# Patient Record
Sex: Female | Born: 1960 | Race: White | Hispanic: No | Marital: Married | Smoking: Never smoker
Health system: Southern US, Community
[De-identification: ages and names within clinical notes are randomized; demographics above are authoritative.]

## PROBLEM LIST (undated history)

## (undated) DIAGNOSIS — T7840XA Allergy, unspecified, initial encounter: Secondary | ICD-10-CM

## (undated) DIAGNOSIS — R011 Cardiac murmur, unspecified: Secondary | ICD-10-CM

## (undated) HISTORY — PX: TONSILLECTOMY: SUR1361

## (undated) HISTORY — DX: Cardiac murmur, unspecified: R01.1

## (undated) HISTORY — DX: Allergy, unspecified, initial encounter: T78.40XA

## (undated) HISTORY — PX: OTHER SURGICAL HISTORY: SHX169

---

## 1999-08-19 ENCOUNTER — Other Ambulatory Visit: Admission: RE | Admit: 1999-08-19 | Discharge: 1999-08-19 | Payer: Self-pay | Admitting: Obstetrics and Gynecology

## 2000-09-13 ENCOUNTER — Other Ambulatory Visit: Admission: RE | Admit: 2000-09-13 | Discharge: 2000-09-13 | Payer: Self-pay | Admitting: Obstetrics and Gynecology

## 2001-09-19 ENCOUNTER — Other Ambulatory Visit: Admission: RE | Admit: 2001-09-19 | Discharge: 2001-09-19 | Payer: Self-pay | Admitting: Obstetrics and Gynecology

## 2002-11-01 ENCOUNTER — Other Ambulatory Visit: Admission: RE | Admit: 2002-11-01 | Discharge: 2002-11-01 | Payer: Self-pay | Admitting: Obstetrics and Gynecology

## 2003-11-04 ENCOUNTER — Other Ambulatory Visit: Admission: RE | Admit: 2003-11-04 | Discharge: 2003-11-04 | Payer: Self-pay | Admitting: Obstetrics and Gynecology

## 2003-12-09 ENCOUNTER — Encounter (INDEPENDENT_AMBULATORY_CARE_PROVIDER_SITE_OTHER): Payer: Self-pay | Admitting: Specialist

## 2003-12-09 ENCOUNTER — Ambulatory Visit (HOSPITAL_COMMUNITY): Admission: RE | Admit: 2003-12-09 | Discharge: 2003-12-09 | Payer: Self-pay | Admitting: Obstetrics and Gynecology

## 2004-11-17 ENCOUNTER — Other Ambulatory Visit: Admission: RE | Admit: 2004-11-17 | Discharge: 2004-11-17 | Payer: Self-pay | Admitting: Obstetrics and Gynecology

## 2006-02-15 ENCOUNTER — Encounter: Admission: RE | Admit: 2006-02-15 | Discharge: 2006-02-15 | Payer: Self-pay | Admitting: Obstetrics and Gynecology

## 2006-08-08 ENCOUNTER — Encounter: Admission: RE | Admit: 2006-08-08 | Discharge: 2006-08-08 | Payer: Self-pay | Admitting: Obstetrics and Gynecology

## 2007-02-21 ENCOUNTER — Encounter: Admission: RE | Admit: 2007-02-21 | Discharge: 2007-02-21 | Payer: Self-pay | Admitting: Obstetrics and Gynecology

## 2008-04-10 ENCOUNTER — Ambulatory Visit (HOSPITAL_COMMUNITY): Admission: RE | Admit: 2008-04-10 | Discharge: 2008-04-10 | Payer: Self-pay | Admitting: Obstetrics and Gynecology

## 2008-04-17 ENCOUNTER — Encounter: Admission: RE | Admit: 2008-04-17 | Discharge: 2008-04-17 | Payer: Self-pay | Admitting: Obstetrics and Gynecology

## 2008-12-04 ENCOUNTER — Encounter: Admission: RE | Admit: 2008-12-04 | Discharge: 2008-12-04 | Payer: Self-pay | Admitting: Family Medicine

## 2009-04-21 ENCOUNTER — Encounter: Admission: RE | Admit: 2009-04-21 | Discharge: 2009-04-21 | Payer: Self-pay | Admitting: Obstetrics and Gynecology

## 2010-03-07 IMAGING — MG MM SCREEN MAMMOGRAM BILATERAL
4 series · 4 of 4 positions shown · non-contrast
Comparison: none

DG SCREEN MAMMOGRAM BILATERAL
Bilateral CC and MLO view(s) were taken.

DIGITAL SCREENING MAMMOGRAM WITH CAD:
There are scattered fibroglandular densities.  No masses or malignant type calcifications are 
identified.  Compared with prior studies.
Images were processed with CAD.

[R CC]
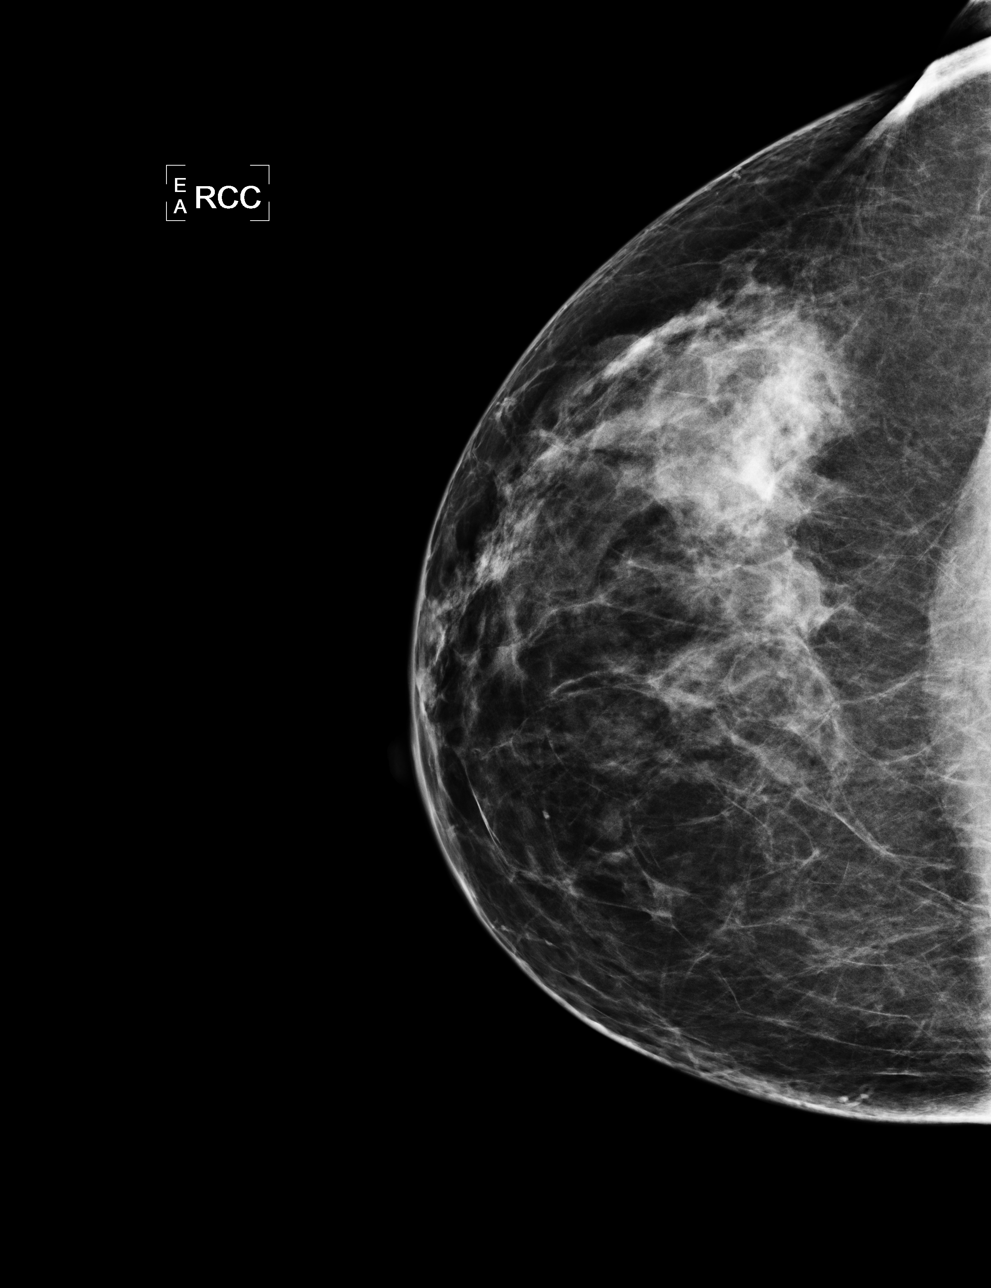

[L CC]
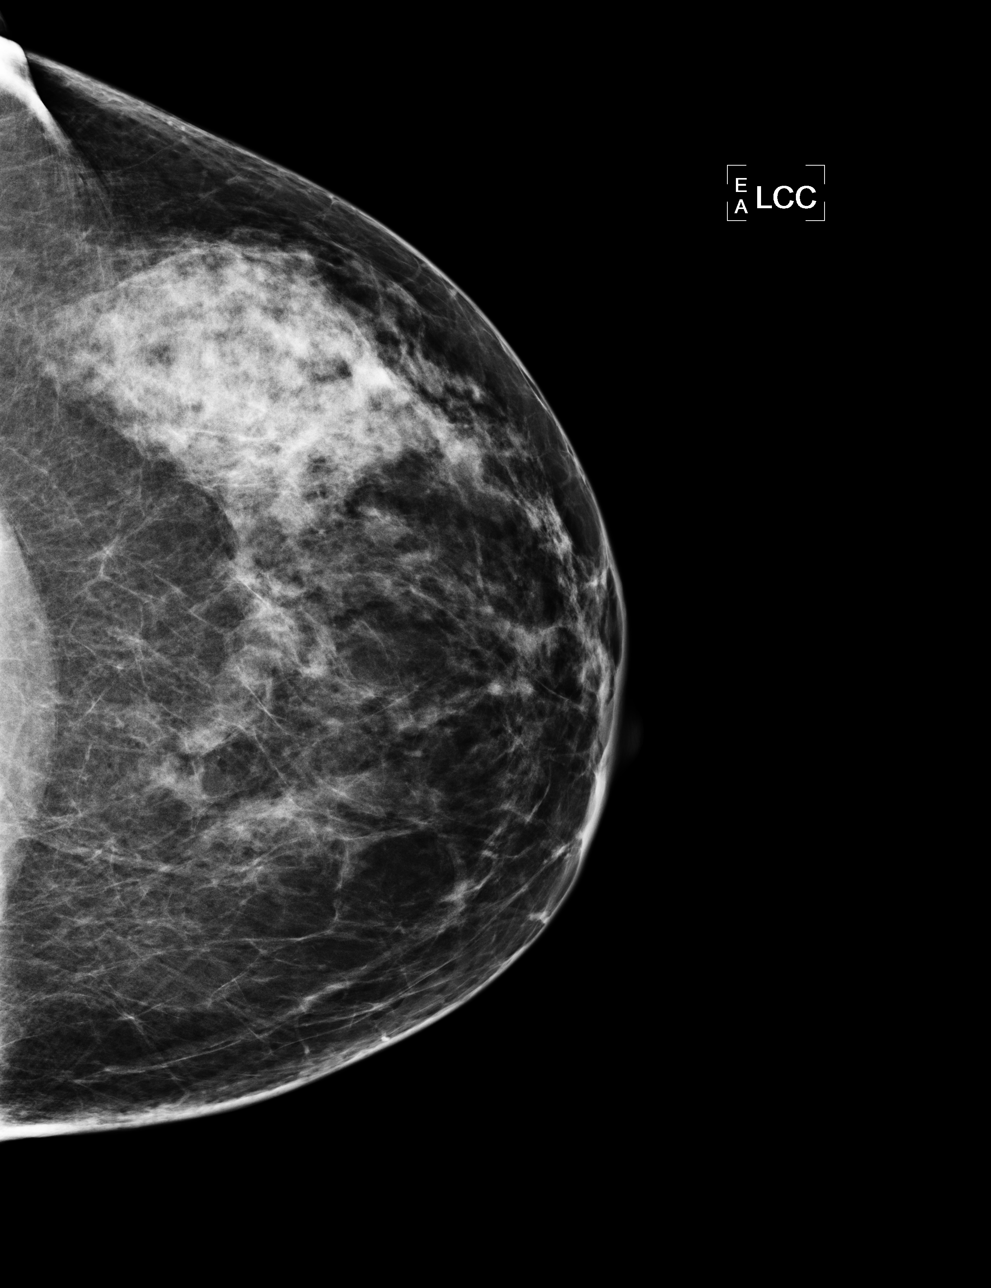

[L MLO]
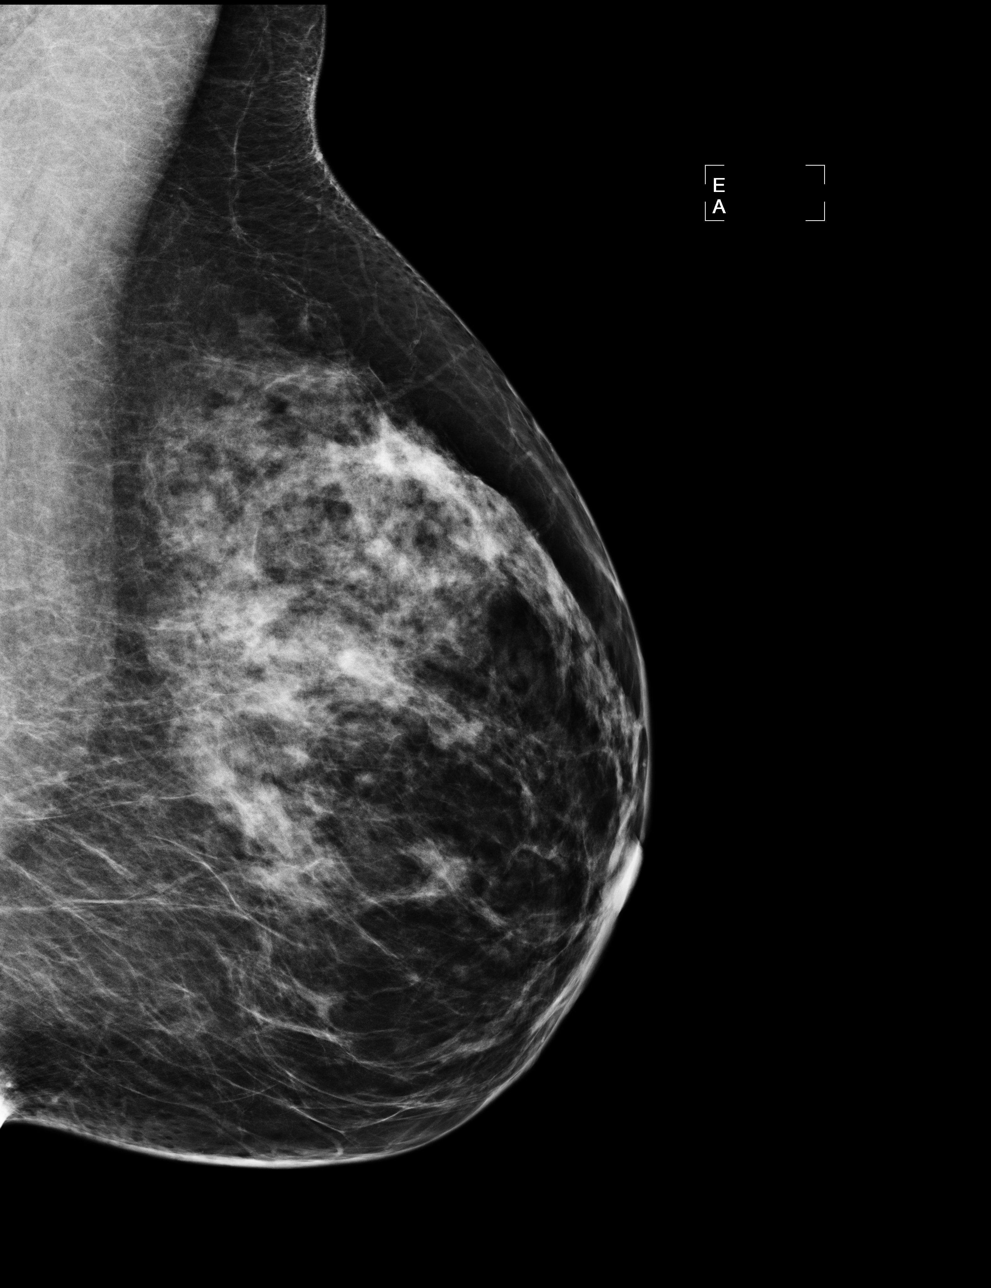

[R MLO]
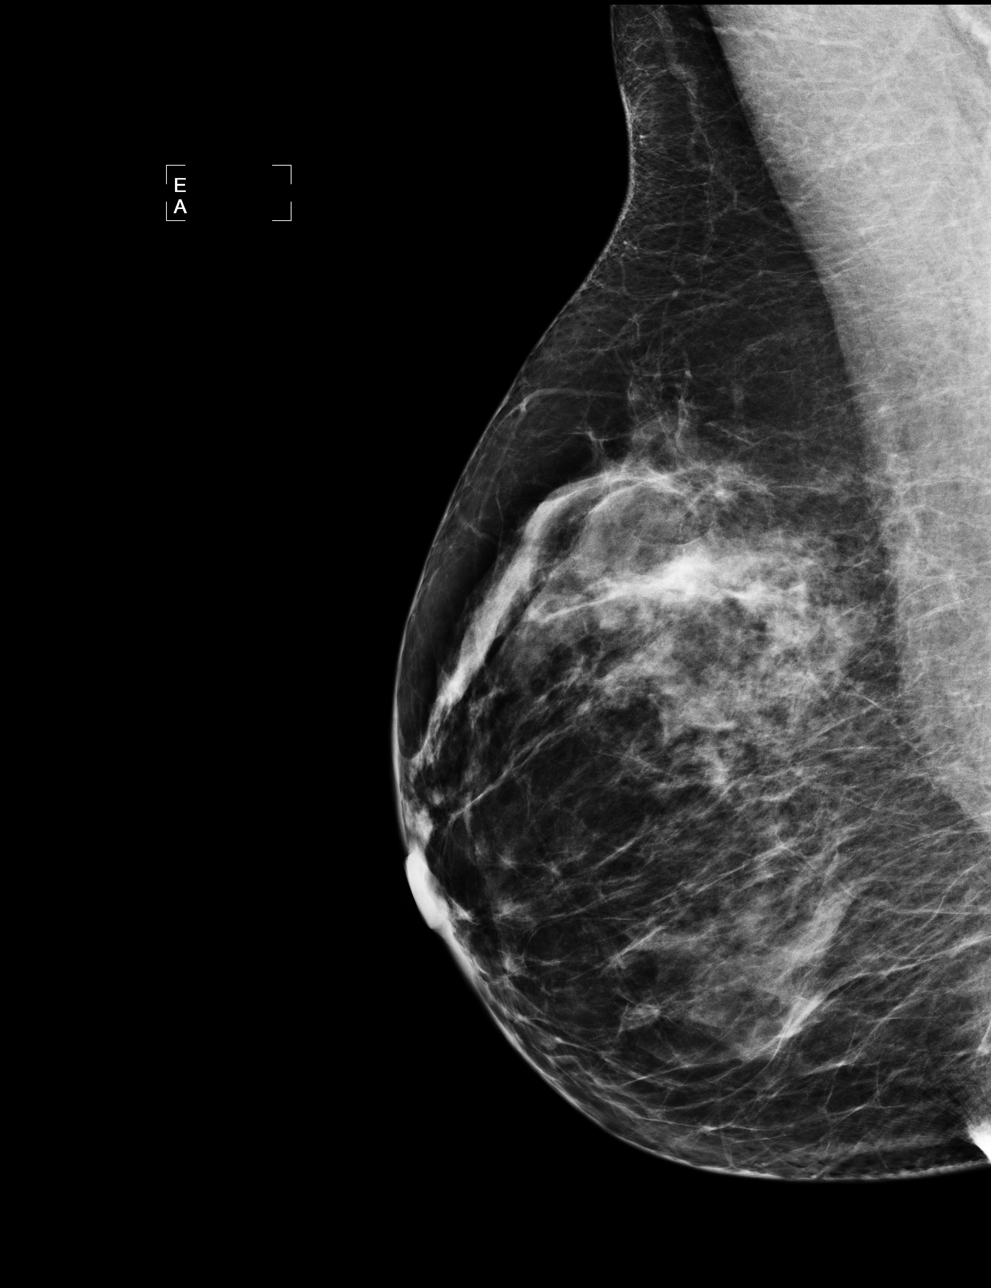

[4 of 4 positions shown; findings below may reference images not displayed]

IMPRESSION: No specific mammographic evidence of malignancy.  Next screening mammogram is recommended in one 
year.

A result letter of this screening mammogram will be mailed directly to the patient.

ASSESSMENT: Negative - BI-RADS 1

Screening mammogram in 1 year.
ANALYZED BY COMPUTER AIDED DETECTION. , THIS PROCEDURE WAS A DIGITAL MAMMOGRAM.

## 2010-10-25 ENCOUNTER — Encounter: Payer: Self-pay | Admitting: Obstetrics and Gynecology

## 2010-10-26 ENCOUNTER — Encounter: Payer: Self-pay | Admitting: Obstetrics and Gynecology

## 2011-02-19 NOTE — Op Note (Signed)
NAME:  Jodi Strickland, LEDER                         ACCOUNT NO.:  [id number]   MEDICAL RECORD NO.:  [id number]                   PATIENT TYPE:  AMB   LOCATION:  SDC                                  FACILITY:  [facility]   PHYSICIAN:  Dineen Kid. Rana Snare, M.D.                 DATE OF BIRTH:  06-26-1961   DATE OF PROCEDURE:  12/09/2003  DATE OF DISCHARGE:                                 OPERATIVE REPORT   ADDENDUM:   ADDITIONAL PREOPERATIVE DIAGNOSIS:  Vulvar skin tag.   ADDITIONAL POSTOPERATIVE DIAGNOSIS:  Vulvar skin tag.   ADDITIONAL PROCEDURE:  Removal of skin tag.   DESCRIPTION OF PROCEDURE:  Sharply excised with scissors using pickups and  scissors.  The base of the skin tag was made hemostatic and approximated  with single horizontal mattress suture of 4-0 Vicryl Rapide with good  approximation and good hemostasis.   ESTIMATED BLOOD LOSS:  Less than 5 mL.                                               Dineen Kid Rana Snare, M.D.    DCL/MEDQ  D:  12/09/2003  T:  12/10/2003  Job:  [id number]

## 2011-02-19 NOTE — Op Note (Signed)
NAME:  Jodi Strickland, Jodi Strickland                         ACCOUNT NO.:  [id number]   MEDICAL RECORD NO.:  [id number]                   PATIENT TYPE:  AMB   LOCATION:  SDC                                  FACILITY:  [facility]   PHYSICIAN:  Dineen Kid. Rana Snare, M.D.                 DATE OF BIRTH:  07/27/1961   DATE OF PROCEDURE:  12/09/2003  DATE OF DISCHARGE:                                 OPERATIVE REPORT   PREOPERATIVE DIAGNOSES:  1. Abnormal uterine bleeding.  2. Endometrial mass on sonohistogram.   POSTOPERATIVE DIAGNOSES:  1. Abnormal uterine bleeding.  2. Endometrial mass on sonohistogram.  3. Uterine septum.   PROCEDURE:  Hysteroscopy D&C, with removal of endometrial polyps and  resection of septum.   SURGEON:  Dineen Kid. Rana Snare, M.D.   ANESTHESIA:  General by mask.   INDICATIONS:  Ms. Jodi Strickland is a 50 year old G2, P2 who has been having problems  with abnormal uterine bleeding.  She has known fibroids.  She underwent a  sonohistogram which shows endometrial masses consistent with polyps.  She  presents for definitive surgical evaluation and treatment of this.  The  risks and benefits were discussed, which include but not limited to:  risks  of infection, bleeding, and damage to uterus, tubes and ovaries, bowel or  bladder; possibly recurrence of the bleeding or not able to correct the  bleeding.  She does give her informed consent.   FINDINGS AT TIME OF SURGERY:  A large polyp coming from the right lower  uterine segment. She does have a septum in the mid portion, approximately 2-  3 cm in length, but 1-2 mm in width.  The ostia were within normal limits.  There was a large polyp coming from the left side of the septum, going into  the left cornua.  The cervix was within normal limits.   DESCRIPTION OF PROCEDURE:  After adequate analgesia, the patient was placed  in the dorsal lithotomy position.  She was sterilely prepped and draped and  the bladder was sterilely drained.  A Graves speculum  was placed.  Tenaculum  placed on the lip of the cervix.  Uterus was sounded to  9 cm and easily dilated to a #33 Pratt dilator.   The hysteroscope was inserted; the above findings were noted.  The  endometrial polyp was grasped along the right lateral wall, and easily  removed.  Curettage was then performed.  The endometrial polyp from the left  portion of the septum was grasped and partially removed.  Upon further  removal with curettage and with endometrial polyps, a portion of the septum  was torn, so resectoscope was inserted and the remaining fragments of the  endometrial septum were resected with a Endo loop, with good resection  noted.  Hemostasis was achieved.   Curettage was performed throughout the endometrial cavity -- revealing a  gritty service throughout the endometrium.  By  palpation no residual  underlying remnants of the septum.  Re-examination did confirm the above,  with a normal-appearing endometrial cavity at this time.   The resectoscope was removed, as was the tenaculum off the cervix.  The  cervix was noted to be hemostatic.  The patient was then transferred to the  recovery room in stable condition.  The patient was given 30 mg of Toradol  after the procedure.   SORBITOL DEFICIT:  100 cc.   ESTIMATED BLOOD LOSS:  Less than 20 cc.   DISPOSITION:  The patient will be discharged home.  Will follow up in the  office in two to three weeks.  She received instruction sheets for D&C.  She  is advised to return for increased pain, fever or bleeding.                                               Dineen Kid Rana Snare, M.D.    DCL/MEDQ  D:  12/09/2003  T:  12/10/2003  Job:  [id number]

## 2011-10-13 ENCOUNTER — Encounter: Payer: Self-pay | Admitting: Gastroenterology

## 2011-10-20 ENCOUNTER — Ambulatory Visit (AMBULATORY_SURGERY_CENTER): Payer: 59 | Admitting: *Deleted

## 2011-10-20 VITALS — Ht 64.0 in | Wt 148.6 lb

## 2011-10-20 DIAGNOSIS — Z1211 Encounter for screening for malignant neoplasm of colon: Secondary | ICD-10-CM

## 2011-10-20 MED ORDER — PEG-KCL-NACL-NASULF-NA ASC-C 100 G PO SOLR: 1.0000 | Freq: Once | ORAL | Status: DC

## 2011-11-01 ENCOUNTER — Encounter: Payer: Self-pay | Admitting: Gastroenterology

## 2011-11-04 ENCOUNTER — Ambulatory Visit (AMBULATORY_SURGERY_CENTER): Payer: 59 | Admitting: Gastroenterology

## 2011-11-04 ENCOUNTER — Encounter: Payer: Self-pay | Admitting: Gastroenterology

## 2011-11-04 DIAGNOSIS — K5520 Angiodysplasia of colon without hemorrhage: Secondary | ICD-10-CM

## 2011-11-04 DIAGNOSIS — Z1211 Encounter for screening for malignant neoplasm of colon: Secondary | ICD-10-CM

## 2011-11-04 MED ORDER — SODIUM CHLORIDE 0.9 % IV SOLN: 500.0000 mL | INTRAVENOUS | Status: DC

## 2011-11-04 NOTE — Op Note (Signed)
[facility] [address] [place], [place]  [address]  COLONOSCOPY PROCEDURE REPORT  PATIENT:  Jodi, Strickland  MR#:  [id number] BIRTHDATE:  [birth year], 50 yrs. old  GENDER:  female ENDOSCOPIST:  Barbette Hair. Arlyce Dice, MD REF. BY:  Herb Grays, M.D. PROCEDURE DATE:  11/04/2011 PROCEDURE:  Diagnostic Colonoscopy ASA CLASS:  Class I INDICATIONS:  Routine Risk Screening MEDICATIONS:   MAC sedation, administered by CRNA propofol 250mg IV  DESCRIPTION OF PROCEDURE:   After the risks benefits and alternatives of the procedure were thoroughly explained, informed consent was obtained.  Digital rectal exam was performed and revealed small external hemorrhoids.   The [id number] endoscope was introduced through the anus and advanced to the cecum, which was identified by both the appendix and ileocecal valve, without limitations.  The quality of the prep was excellent, using MoviPrep.  The instrument was then slowly withdrawn as the colon was fully examined. <<PROCEDUREIMAGES>>  FINDINGS:  Arteriovenous malformations were seen in the (see image3, image4, and image5). 2 2mm AVMs ascending colon, single 3mm AVM descending colon  This was otherwise a normal examination of the colon (see image1, image6, and image7).   Retroflexed views in the rectum revealed no abnormalities.    The time to cecum = 1) 6.0  minutes. The scope was then withdrawn in  1) 10.50  minutes from the cecum and the procedure completed. COMPLICATIONS:  None ENDOSCOPIC IMPRESSION: 1) AVM's 2) Otherwise normal examination RECOMMENDATIONS: 1) Continue current colorectal screening recommendations for "routine risk" patients with a repeat colonoscopy in 10 years. REPEAT EXAM:  In 10 year(s) for Colonoscopy.  ______________________________ Barbette Hair. Arlyce Dice, MD  CC:  n. eSIGNED:   Barbette Hair. Koralyn Prestage at 11/04/2011 09:34 AM  Thana Ates, [phone]

## 2011-11-04 NOTE — Patient Instructions (Signed)
Resume medications. D/C Instructions reviewed with family. 

## 2011-11-04 NOTE — Progress Notes (Signed)
Patient did not experience any of the following events: a burn prior to discharge; a fall within the facility; wrong site/side/patient/procedure/implant event; or a hospital transfer or hospital admission upon discharge from the facility. (G8907) Patient did not have preoperative order for IV antibiotic SSI prophylaxis. (G8918)  

## 2011-11-05 ENCOUNTER — Telehealth: Payer: Self-pay | Admitting: *Deleted

## 2011-11-05 NOTE — Telephone Encounter (Signed)
  Follow up Call-  Call back number 11/04/2011  Post procedure Call Back phone  # [phone]  Permission to leave phone message Yes     Patient questions:  Do you have a fever, pain , or abdominal swelling? no Pain Score  0 *  Have you tolerated food without any problems? yes  Have you been able to return to your normal activities? yes  Do you have any questions about your discharge instructions: Diet   no Medications  no Follow up visit  no  Do you have questions or concerns about your Care? yes  Actions: * If pain score is 4 or above: No action needed, pain <4.

## 2016-05-10 ENCOUNTER — Other Ambulatory Visit: Payer: Self-pay | Admitting: Family Medicine

## 2016-05-10 DIAGNOSIS — Z1231 Encounter for screening mammogram for malignant neoplasm of breast: Secondary | ICD-10-CM

## 2016-05-19 ENCOUNTER — Ambulatory Visit
Admission: RE | Admit: 2016-05-19 | Discharge: 2016-05-19 | Disposition: A | Discharge status: Home / Self Care | Payer: PRIVATE HEALTH INSURANCE | Source: Ambulatory Visit | Attending: Family Medicine | Admitting: Family Medicine

## 2016-05-19 DIAGNOSIS — Z1231 Encounter for screening mammogram for malignant neoplasm of breast: Secondary | ICD-10-CM

## 2018-05-19 ENCOUNTER — Other Ambulatory Visit: Payer: Self-pay | Admitting: Family Medicine

## 2018-05-19 DIAGNOSIS — Z1231 Encounter for screening mammogram for malignant neoplasm of breast: Secondary | ICD-10-CM

## 2018-06-19 ENCOUNTER — Ambulatory Visit: Payer: PRIVATE HEALTH INSURANCE

## 2018-06-19 ENCOUNTER — Ambulatory Visit
Admission: RE | Admit: 2018-06-19 | Discharge: 2018-06-19 | Disposition: A | Discharge status: Home / Self Care | Payer: PRIVATE HEALTH INSURANCE | Source: Ambulatory Visit | Attending: Family Medicine | Admitting: Family Medicine

## 2018-06-19 DIAGNOSIS — Z1231 Encounter for screening mammogram for malignant neoplasm of breast: Secondary | ICD-10-CM

## 2020-08-18 ENCOUNTER — Ambulatory Visit (INDEPENDENT_AMBULATORY_CARE_PROVIDER_SITE_OTHER): Payer: PRIVATE HEALTH INSURANCE | Admitting: Otolaryngology

## 2020-08-18 ENCOUNTER — Encounter (INDEPENDENT_AMBULATORY_CARE_PROVIDER_SITE_OTHER): Payer: Self-pay | Admitting: Otolaryngology

## 2020-08-18 ENCOUNTER — Other Ambulatory Visit: Payer: Self-pay

## 2020-08-18 VITALS — Temp 97.3°F

## 2020-08-18 DIAGNOSIS — H9042 Sensorineural hearing loss, unilateral, left ear, with unrestricted hearing on the contralateral side: Secondary | ICD-10-CM

## 2020-08-18 NOTE — Progress Notes (Signed)
HPI: Jodi Strickland is a 59 y.o. female who presents for evaluation of left ear hearing loss. Patient has noticed gradual hearing loss in the left ear over the past year or 2. She states that she had a hearing test during pregnancy but her hearing returned back to normal. She has not had a hearing test in the last 20 years. She brings with her a recent hearing test that showed a left ear sensorineural hearing loss with essentially normal hearing in the right ear. Right ear pure tones were approximately 10 DB up to 3000 frequency and then declined. Left ear hearing was approximately 30 DB and then declined above 2000 DB.  Past Medical History:  Diagnosis Date  . Allergy   . Heart murmur    Past Surgical History:  Procedure Laterality Date  . c sections     twice  . TONSILLECTOMY     Social History   Socioeconomic History  . Marital status: Married    Spouse name: Not on file  . Number of children: Not on file  . Years of education: Not on file  . Highest education level: Not on file  Occupational History  . Not on file  Tobacco Use  . Smoking status: Never Smoker  . Smokeless tobacco: Never Used  Substance and Sexual Activity  . Alcohol use: No  . Drug use: No  . Sexual activity: Not on file  Other Topics Concern  . Not on file  Social History Narrative  . Not on file   Social Determinants of Health   Financial Resource Strain:   . Difficulty of Paying Living Expenses: Not on file  Food Insecurity:   . Worried About Charity fundraiser in the Last Year: Not on file  . Ran Out of Food in the Last Year: Not on file  Transportation Needs:   . Lack of Transportation (Medical): Not on file  . Lack of Transportation (Non-Medical): Not on file  Physical Activity:   . Days of Exercise per Week: Not on file  . Minutes of Exercise per Session: Not on file  Stress:   . Feeling of Stress : Not on file  Social Connections:   . Frequency of Communication with Friends and  Family: Not on file  . Frequency of Social Gatherings with Friends and Family: Not on file  . Attends Religious Services: Not on file  . Active Member of Clubs or Organizations: Not on file  . Attends Archivist Meetings: Not on file  . Marital Status: Not on file   Family History  Problem Relation Age of Onset  . Colon cancer Neg Hx   . Esophageal cancer Neg Hx   . Rectal cancer Neg Hx   . Stomach cancer Neg Hx   . Breast cancer Neg Hx    Allergies  Allergen Reactions  . Penicillins Rash   Prior to Admission medications   Medication Sig Start Date End Date Taking? Authorizing Provider  CALCIUM PO Take 1,200 mg by mouth daily.   Yes [provider]  Cholecalciferol (VITAMIN D PO) Take 1,000 Units by mouth daily.   Yes [provider]  DOCUSATE CALCIUM PO Take 100 mg by mouth daily.   Yes [provider]  Ibuprofen (ADVIL) 200 MG CAPS Take 200 mg by mouth daily.   Yes [provider]  MAGNESIUM PO Take 250 mg by mouth daily.   Yes [provider]     Positive ROS: Otherwise  negative  All other systems have been reviewed and were otherwise negative with the exception of those mentioned in the HPI and as above.  Physical Exam: Constitutional: Alert, well-appearing, no acute distress Ears: External ears without lesions or tenderness. Ear canals are clear bilaterally with intact, clear TMs bilaterally. TMs have good mobility on pneumatic otoscopy and no middle ear space abnormality noted. Nasal: External nose without lesions.. Clear nasal passages Oral: Lips and gums without lesions. Tongue and palate mucosa without lesions. Posterior oropharynx clear. Neck: No palpable adenopathy or masses Respiratory: Breathing comfortably  Skin: No facial/neck lesions or rash noted.  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  Plan: Reviewed with the patient that this is a sensorineural hearing loss and would recommend obtaining a MRI scan to evaluate for cochlear or retrocochlear pathology. She will call us back following results of the scan. She would be a candidate for hearing aid in the left ear but would wait until after the scan.  Radene Journey, MD

## 2020-08-19 ENCOUNTER — Other Ambulatory Visit (INDEPENDENT_AMBULATORY_CARE_PROVIDER_SITE_OTHER): Payer: Self-pay

## 2020-08-19 DIAGNOSIS — H9042 Sensorineural hearing loss, unilateral, left ear, with unrestricted hearing on the contralateral side: Secondary | ICD-10-CM

## 2020-08-20 ENCOUNTER — Telehealth (INDEPENDENT_AMBULATORY_CARE_PROVIDER_SITE_OTHER): Payer: Self-pay

## 2020-08-21 ENCOUNTER — Encounter (INDEPENDENT_AMBULATORY_CARE_PROVIDER_SITE_OTHER): Payer: Self-pay

## 2020-09-06 ENCOUNTER — Other Ambulatory Visit: Payer: PRIVATE HEALTH INSURANCE

## 2020-09-10 ENCOUNTER — Ambulatory Visit
Admission: RE | Admit: 2020-09-10 | Discharge: 2020-09-10 | Disposition: A | Discharge status: Home / Self Care | Payer: PRIVATE HEALTH INSURANCE | Source: Ambulatory Visit | Attending: Otolaryngology | Admitting: Otolaryngology

## 2020-09-10 ENCOUNTER — Other Ambulatory Visit: Payer: Self-pay

## 2020-09-10 DIAGNOSIS — H9042 Sensorineural hearing loss, unilateral, left ear, with unrestricted hearing on the contralateral side: Secondary | ICD-10-CM

## 2020-09-10 MED ORDER — GADOBENATE DIMEGLUMINE 529 MG/ML IV SOLN
15.0000 mL | Freq: Once | INTRAVENOUS | Status: AC | PRN
  Administered 2020-09-10: 15 mL via INTRAVENOUS

## 2020-09-17 NOTE — Telephone Encounter (Signed)
Patient called today concerning results of her MRI scan.  The MRI scan was unremarkable with no evidence of brainstem or internal auditory canal or cochlear pathology. Discussed with patient concerning further treatment of her hearing loss and would recommend proceeding with obtaining hearing aid for the ear.

## 2021-12-11 ENCOUNTER — Telehealth: Payer: Self-pay

## 2021-12-11 NOTE — Telephone Encounter (Signed)
I have called and left a message for patient to call and make an appointment with previsit for recall colonoscopy.  ?

## 2022-01-14 ENCOUNTER — Encounter: Payer: Self-pay | Admitting: Gastroenterology

## 2022-01-25 ENCOUNTER — Encounter: Payer: Self-pay | Admitting: Gastroenterology

## 2022-01-25 ENCOUNTER — Ambulatory Visit (INDEPENDENT_AMBULATORY_CARE_PROVIDER_SITE_OTHER): Payer: PRIVATE HEALTH INSURANCE | Admitting: Gastroenterology

## 2022-01-25 VITALS — BP 120/84 | HR 78 | Ht 64.0 in | Wt 161.0 lb

## 2022-01-25 DIAGNOSIS — R194 Change in bowel habit: Secondary | ICD-10-CM

## 2022-01-25 DIAGNOSIS — R1310 Dysphagia, unspecified: Secondary | ICD-10-CM | POA: Diagnosis not present

## 2022-01-25 DIAGNOSIS — Z1211 Encounter for screening for malignant neoplasm of colon: Secondary | ICD-10-CM | POA: Diagnosis not present

## 2022-01-25 DIAGNOSIS — K921 Melena: Secondary | ICD-10-CM

## 2022-01-25 MED ORDER — CLENPIQ 10-3.5-12 MG-GM -GM/160ML PO SOLN: 1.0000 | ORAL | 0 refills | Status: DC

## 2022-01-25 NOTE — Patient Instructions (Addendum)
If you are age 61 or younger, your body mass index should be between 19-25. Your Body mass index is 27.64 kg/m?Marland Kitchen If this is out of the aformentioned range listed, please consider follow up with your Primary Care Provider.  ? ?__________________________________________________________ ? ?The [facility] providers would like to encourage you to use [facility] to communicate with providers for non-urgent requests or questions.  Due to long hold times on the telephone, sending your provider a message by [facility] may be a faster and more efficient way to get a response.  Please allow 48 business hours for a response.  Please remember that this is for non-urgent requests.  ? ?Due to recent changes in healthcare laws, you may see the results of your imaging and laboratory studies on MyChart before your provider has had a chance to review them.  We understand that in some cases there may be results that are confusing or concerning to you. Not all laboratory results come back in the same time frame and the provider may be waiting for multiple results in order to interpret others.  Please give [place] 48 hours in order for your provider to thoroughly review all the results before contacting the office for clarification of your results.  ? ?You have been scheduled for a colonoscopy, and endoscopy. Please follow written instructions given to you at your visit today.  ?Please pick up your prep supplies at the pharmacy within the next 1-3 days. ?If you use inhalers (even only as needed), please bring them with you on the day of your procedure.  ? ?We have sent the following medications to your pharmacy for you to pick up at your convenience: Clenpiq ? ? ?Thank you for choosing me and [facility]. ? ?Gerrit Heck, D.O.  ? ? ?

## 2022-01-25 NOTE — Progress Notes (Signed)
? ? ?          ?Chief Complaint: Hematochezia, colon cancer screening ? ?Referring Provider:     Self ? ? ? ?HPI:   ? ? ?Jodi Strickland is a 61 y.o. female referred to the Gastroenterology Clinic for evaluation of hematochezia and to discuss ongoing colon cancer screening. ? ?Sxs started in 2021 as intemrittent BRBPR. Sxs resolved, then recurred over the last few months. No dyschezia, abdominal pain. Started having hard stools and straining to have BM in last year or so. Has been taking Colace and Psyllium husk daily for last 6-12 months with improvement.  ? ?No known family history of CRC, GI malignancy, liver disease, pancreatic disease, or IBD.  ? ?Separately, she has been having intermittent solid food dysphagia.  Tends to occur with tuna salad, bread, rice.  Clears with water, but occasionally has to regurgitate food back out.  No history of food impactions requiring ER evaluation.  No prior EGD.  No recent history of heartburn, regurgitation, GERD symptoms. ?  ? ?Endoscopic History: ?- Colonoscopy (10/2011): Scattered small nonbleeding AVMs in the ascending and descending colon.  Otherwise normal colon.  Repeat in 10 years ? ? ?Past Medical History:  ?Diagnosis Date  ? Allergy   ? Heart murmur   ? ? ? ?Past Surgical History:  ?Procedure Laterality Date  ? c sections    ? twice  ? TONSILLECTOMY    ? TONSILLECTOMY    ? ?Family History  ?Problem Relation Age of Onset  ? Hypertension Mother   ? Stroke Father   ? CVA Father   ? Colon cancer Neg Hx   ? Esophageal cancer Neg Hx   ? Rectal cancer Neg Hx   ? Stomach cancer Neg Hx   ? Breast cancer Neg Hx   ? ?Social History  ? ?Tobacco Use  ? Smoking status: Never  ? Smokeless tobacco: Never  ?Vaping Use  ? Vaping Use: Never used  ?Substance Use Topics  ? Alcohol use: No  ? Drug use: No  ? ?Current Outpatient Medications  ?Medication Sig Dispense Refill  ? CALCIUM PO Take 1,200 mg by mouth daily.    ? Cholecalciferol (VITAMIN D PO) Take 1,000 Units by mouth daily.     ? DOCUSATE CALCIUM PO Take 100 mg by mouth daily.    ? fluticasone (FLONASE) 50 MCG/ACT nasal spray Place into both nostrils daily.    ? loratadine (CLARITIN) 10 MG tablet Take 10 mg by mouth daily.    ? MAGNESIUM PO Take 250 mg by mouth daily.    ? ?No current facility-administered medications for this visit.  ? ?Allergies  ?Allergen Reactions  ? Sulfa Antibiotics   ? Penicillins Rash  ? ? ? ?Review of Systems: ?All systems reviewed and negative except where noted in HPI.  ? ? ? ?Physical Exam:   ? ?Wt Readings from Last 3 Encounters:  ?01/25/22 161 lb (73 kg)  ?11/04/11 148 lb (67.1 kg)  ?10/20/11 148 lb 9.6 oz (67.4 kg)  ? ? ?BP 120/84 (BP Location: Left Arm, Patient Position: Sitting, Cuff Size: Normal)   Pulse 78   Ht '5\' 4"'$  (1.626 m)   Wt 161 lb (73 kg)   LMP 10/03/2011   SpO2 98%   BMI 27.64 kg/m?  ?Constitutional:  Pleasant, in no acute distress. ?Psychiatric: Normal mood and affect. Behavior is normal. ?Neck supple. No cervical LAD. ?Cardiovascular: Normal rate, regular rhythm. No edema ?Pulmonary/chest: Effort normal and breath sounds  normal. No wheezing, rales or rhonchi. ?Abdominal: Soft, nondistended, nontender. Bowel sounds active throughout. There are no masses palpable. No hepatomegaly. ?Neurological: Alert and oriented to person place and time. ?Skin: Skin is warm and dry. No rashes noted. ?Rectal: Exam deferred to time of colonoscopy.  ? ? ? ?ASSESSMENT AND PLAN;  ? ?1) Colon Cancer screening ?- Due for ongoing CRC screening ?- Schedule colonoscopy ? ?2) Hematochezia ?- Discussed broad DDx to include hemorrhoids, but also has history of AVMs on prior colonoscopy ?- Evaluate for etiology at time of colonoscopy as above ?- If clinically significant hemorrhoids, discussed hemorrhoid band ligation ? ?3) Change in bowel habits ?- Continue psyllium fiber and Colace ?- Evaluate for mucosal/luminal pathology at time of colonoscopy as above ?- Continue adequate hydration ? ?4) Dysphagia ?- EGD to  evaluate for ring, stricture, EOE, etc. with dilation and biopsies as appropriate ?- Cut food into small pieces, chew thoroughly, drink plenty of fluids with meals ?- Evaluate for subtle signs of reflux at time of EGD ? ?The indications, risks, and benefits of EGD and colonoscopy were explained to the patient in detail. Risks include but are not limited to bleeding, perforation, adverse reaction to medications, and cardiopulmonary compromise. Sequelae include but are not limited to the possibility of surgery, hospitalization, and mortality. The patient verbalized understanding and wished to proceed. All questions answered, referred to scheduler and bowel prep ordered. Further recommendations pending results of the exam.  ? ? ? ? ?Lavena Bullion, DO, FACG  01/25/2022, 10:34 AM ? ? ?Fanny Bien, MD ? ?

## 2022-03-02 ENCOUNTER — Encounter: Payer: PRIVATE HEALTH INSURANCE | Admitting: Gastroenterology

## 2022-03-16 ENCOUNTER — Ambulatory Visit (AMBULATORY_SURGERY_CENTER): Payer: PRIVATE HEALTH INSURANCE | Admitting: Gastroenterology

## 2022-03-16 ENCOUNTER — Encounter: Payer: Self-pay | Admitting: Gastroenterology

## 2022-03-16 VITALS — BP 120/74 | HR 73 | Temp 97.1°F | Resp 14 | Ht 64.0 in | Wt 161.0 lb

## 2022-03-16 DIAGNOSIS — D125 Benign neoplasm of sigmoid colon: Secondary | ICD-10-CM

## 2022-03-16 DIAGNOSIS — K2970 Gastritis, unspecified, without bleeding: Secondary | ICD-10-CM

## 2022-03-16 DIAGNOSIS — D123 Benign neoplasm of transverse colon: Secondary | ICD-10-CM | POA: Diagnosis not present

## 2022-03-16 DIAGNOSIS — D120 Benign neoplasm of cecum: Secondary | ICD-10-CM | POA: Diagnosis not present

## 2022-03-16 DIAGNOSIS — Z1211 Encounter for screening for malignant neoplasm of colon: Secondary | ICD-10-CM | POA: Diagnosis present

## 2022-03-16 DIAGNOSIS — K641 Second degree hemorrhoids: Secondary | ICD-10-CM

## 2022-03-16 DIAGNOSIS — D124 Benign neoplasm of descending colon: Secondary | ICD-10-CM | POA: Diagnosis not present

## 2022-03-16 DIAGNOSIS — K200 Eosinophilic esophagitis: Secondary | ICD-10-CM | POA: Diagnosis not present

## 2022-03-16 DIAGNOSIS — R1310 Dysphagia, unspecified: Secondary | ICD-10-CM

## 2022-03-16 DIAGNOSIS — K2990 Gastroduodenitis, unspecified, without bleeding: Secondary | ICD-10-CM | POA: Diagnosis not present

## 2022-03-16 DIAGNOSIS — K319 Disease of stomach and duodenum, unspecified: Secondary | ICD-10-CM

## 2022-03-16 MED ORDER — SODIUM CHLORIDE 0.9 % IV SOLN: 500.0000 mL | Freq: Once | INTRAVENOUS | Status: DC

## 2022-03-16 NOTE — Op Note (Signed)
[place] Patient Name: Lanaya Bennis Procedure Date: 03/16/2022 10:12 AM MRN: [id number] Endoscopist: Gerrit Heck , MD Age: 61 Referring MD:  Date of Birth: 09/08/1961 Gender: Female Account #: [id number] Procedure:                Colonoscopy Indications:              Screening for colorectal malignant neoplasm (last                            colonoscopy was 10 years ago) Medicines:                Monitored Anesthesia Care Procedure:                Pre-Anesthesia Assessment:                           - Prior to the procedure, a History and Physical                            was performed, and patient medications and                            allergies were reviewed. The patient's tolerance of                            previous anesthesia was also reviewed. The risks                            and benefits of the procedure and the sedation                            options and risks were discussed with the patient.                            All questions were answered, and informed consent                            was obtained. Prior Anticoagulants: The patient has                            taken no previous anticoagulant or antiplatelet                            agents. ASA Grade Assessment: II - A patient with                            mild systemic disease. After reviewing the risks                            and benefits, the patient was deemed in                            satisfactory condition to undergo the procedure.  After obtaining informed consent, the colonoscope                            was passed under direct vision. Throughout the                            procedure, the patient's blood pressure, pulse, and                            oxygen saturations were monitored continuously. The                            CF [id number] #[id number] was introduced through the anus                            and advanced to the the  terminal ileum. The                            colonoscopy was performed without difficulty. The                            patient tolerated the procedure well. The quality                            of the bowel preparation was good. The terminal                            ileum, ileocecal valve, appendiceal orifice, and                            rectum were photographed. Scope In: 10:46:35 AM Scope Out: 11:09:56 AM Scope Withdrawal Time: 0 hours 19 minutes 15 seconds  Total Procedure Duration: 0 hours 23 minutes 21 seconds  Findings:                 Hemorrhoids were found on perianal exam.                           A 2 mm polyp was found in the cecum. The polyp was                            sessile. The polyp was removed with a cold snare.                            Resection and retrieval were complete. Estimated                            blood loss was minimal.                           Three sessile polyps were found in the transverse                            colon (1) and ascending colon (2). The  polyps were                            3 to 4 mm in size. These polyps were removed with a                            cold snare. Resection and retrieval were complete.                            Estimated blood loss was minimal.                           Seven sessile polyps were found in the                            recto-sigmoid colon and sigmoid colon. The polyps                            were 2 to 5 mm in size. These polyps were removed                            with a cold snare. Resection and retrieval were                            complete. Estimated blood loss was minimal.                           Non-bleeding internal hemorrhoids were found during                            retroflexion. The hemorrhoids were small and Grade                            II (internal hemorrhoids that prolapse but reduce                            spontaneously).                            The terminal ileum appeared normal. Complications:            No immediate complications. Estimated Blood Loss:     Estimated blood loss was minimal. Impression:               - Hemorrhoids found on perianal exam.                           - One 2 mm polyp in the cecum, removed with a cold                            snare. Resected and retrieved.                           - Three 3 to 4 mm polyps in the transverse colon  and in the ascending colon, removed with a cold                            snare. Resected and retrieved.                           - Seven 2 to 5 mm polyps at the recto-sigmoid colon                            and in the sigmoid colon, removed with a cold                            snare. Resected and retrieved.                           - Non-bleeding internal hemorrhoids.                           - The examined portion of the ileum was normal. Recommendation:           - Patient has a contact number available for                            emergencies. The signs and symptoms of potential                            delayed complications were discussed with the                            patient. Return to normal activities tomorrow.                            Written discharge instructions were provided to the                            patient.                           - Resume previous diet.                           - Continue present medications.                           - Await pathology results.                           - Repeat colonoscopy for surveillance based on                            pathology results.                           - Return to GI office PRN.                           -  Internal hemorrhoids were noted on this study and                            may be amenable to hemorrhoid band ligation. If you                            are interested in further treatment of these                            hemorrhoids with  band ligation, please contact my                            clinic to set up an appointment for evaluation and                            treatment. Gerrit Heck, MD 03/16/2022 11:22:01 AM

## 2022-03-16 NOTE — Op Note (Signed)
[place] Patient Name: Jodi Strickland Procedure Date: 03/16/2022 10:13 AM MRN: [id number] Endoscopist: Gerrit Heck , MD Age: 61 Referring MD:  Date of Birth: 11-09-1960 Gender: Female Account #: [id number] Procedure:                Upper GI endoscopy Indications:              Dysphagia Medicines:                Monitored Anesthesia Care Procedure:                Pre-Anesthesia Assessment:                           - Prior to the procedure, a History and Physical                            was performed, and patient medications and                            allergies were reviewed. The patient's tolerance of                            previous anesthesia was also reviewed. The risks                            and benefits of the procedure and the sedation                            options and risks were discussed with the patient.                            All questions were answered, and informed consent                            was obtained. Prior Anticoagulants: The patient has                            taken no previous anticoagulant or antiplatelet                            agents. ASA Grade Assessment: II - A patient with                            mild systemic disease. After reviewing the risks                            and benefits, the patient was deemed in                            satisfactory condition to undergo the procedure.                           After obtaining informed consent, the endoscope was  passed under direct vision. Throughout the                            procedure, the patient's blood pressure, pulse, and                            oxygen saturations were monitored continuously. The                            GIF [id number] #[id number] was introduced through the                            mouth, and advanced to the second part of duodenum.                            The upper GI endoscopy was accomplished  without                            difficulty. The patient tolerated the procedure                            well. Scope In: Scope Out: Findings:                 The examined esophagus was normal. Given the                            symptoms of solid food dysphagia, the decision was                            made to perform esophageal dilation. A guidewire                            was placed and the scope was withdrawn. Dilation                            was performed with a Savary dilator with no                            resistance at 17 mm. The dilation site was examined                            following endoscope reinsertion and showed no                            bleeding, mucosal tear or perforation. Biopsies                            were obtained from the proximal and distal                            esophagus with cold forceps for histology of  suspected eosinophilic esophagitis. Estimated blood                            loss was minimal.                           The Z-line was regular and was found 37 cm from the                            incisors.                           Localized mild inflammation characterized by                            congestion (edema) and erythema was found in the                            gastric antrum. Biopsies were taken with a cold                            forceps for Helicobacter pylori testing. Estimated                            blood loss was minimal.                           The gastric fundus, gastric body and incisura were                            normal.                           The examined duodenum was normal. Complications:            No immediate complications. Estimated Blood Loss:     Estimated blood loss was minimal. Impression:               - Normal esophagus. Dilated. Biopsied.                           - Z-line regular, 37 cm from the incisors.                           -  Gastritis. Biopsied.                           - Normal gastric fundus, gastric body and incisura.                           - Normal examined duodenum. Recommendation:           - Patient has a contact number available for                            emergencies. The signs and symptoms of potential  delayed complications were discussed with the                            patient. Return to normal activities tomorrow.                            Written discharge instructions were provided to the                            patient.                           - Resume previous diet.                           - Continue present medications.                           - Await pathology results.                           - Repeat upper endoscopy PRN.                           - Return to GI clinic PRN.                           - If dysphagia symptoms persist, will plan for                            Esophageal Manometry to evaluate for dysmotility.                           - Colonoscopy today. Gerrit Heck, MD 03/16/2022 11:15:47 AM

## 2022-03-16 NOTE — Progress Notes (Signed)
Called to room to assist during endoscopic procedure.  Patient ID and intended procedure confirmed with present staff. Received instructions for my participation in the procedure from the performing physician.  

## 2022-03-16 NOTE — Progress Notes (Signed)
GASTROENTEROLOGY PROCEDURE H&P NOTE   Primary Care Physician: Fanny Bien, MD    Reason for Procedure:  Dysphagia, colon cancer screening  Plan:    Colonoscopy, EGD with possible dilation  Patient is appropriate for endoscopic procedure(s) in the ambulatory ([place]) setting.  The nature of the procedure, as well as the risks, benefits, and alternatives were carefully and thoroughly reviewed with the patient. Ample time for discussion and questions allowed. The patient understood, was satisfied, and agreed to proceed.     HPI: Jodi Strickland is a 61 y.o. female who presents for EGD for evaluation of dysphagia along with colonoscopy for routine CRC screening.    Endoscopic History: - Colonoscopy (10/2011): Scattered small nonbleeding AVMs in the ascending and descending colon.  Otherwise normal colon.  Repeat in 10 years  Past Medical History:  Diagnosis Date   Allergy    Heart murmur     Past Surgical History:  Procedure Laterality Date   c sections     twice   TONSILLECTOMY     TONSILLECTOMY      Prior to Admission medications   Medication Sig Start Date End Date Taking? Authorizing Provider  fluticasone (FLONASE) 50 MCG/ACT nasal spray Place into both nostrils daily.   Yes [provider]  loratadine (CLARITIN) 10 MG tablet Take 10 mg by mouth daily.   Yes [provider]  CALCIUM PO Take 1,200 mg by mouth daily.    [provider]  Cholecalciferol (VITAMIN D PO) Take 1,000 Units by mouth daily.    [provider]  DOCUSATE CALCIUM PO Take 100 mg by mouth daily.    [provider]  MAGNESIUM PO Take 250 mg by mouth daily.    [provider]    Current Outpatient Medications  Medication Sig Dispense Refill   fluticasone (FLONASE) 50 MCG/ACT nasal spray Place into both nostrils daily.     loratadine (CLARITIN) 10 MG tablet Take 10 mg by mouth daily.     CALCIUM PO Take 1,200 mg by mouth daily.      Cholecalciferol (VITAMIN D PO) Take 1,000 Units by mouth daily.     DOCUSATE CALCIUM PO Take 100 mg by mouth daily.     MAGNESIUM PO Take 250 mg by mouth daily.     Current Facility-Administered Medications  Medication Dose Route Frequency Provider Last Rate Last Admin   0.9 %  sodium chloride infusion  500 mL Intravenous Once Kimila Papaleo V, DO        Allergies as of 03/16/2022 - Review Complete 03/16/2022  Allergen Reaction Noted   Sulfa antibiotics  01/25/2022   Penicillins Rash 10/20/2011    Family History  Problem Relation Age of Onset   Hypertension Mother    Stroke Father    CVA Father    Colon cancer Neg Hx    Esophageal cancer Neg Hx    Rectal cancer Neg Hx    Stomach cancer Neg Hx    Breast cancer Neg Hx     Social History   Socioeconomic History   Marital status: Married    Spouse name: Not on file   Number of children: Not on file   Years of education: Not on file   Highest education level: Not on file  Occupational History   Not on file  Tobacco Use   Smoking status: Never   Smokeless tobacco: Never  Vaping Use   Vaping Use: Never used  Substance and Sexual Activity  Alcohol use: No   Drug use: No   Sexual activity: Not on file  Other Topics Concern   Not on file  Social History Narrative   Not on file   Social Determinants of Health   Financial Resource Strain: Not on file  Food Insecurity: Not on file  Transportation Needs: Not on file  Physical Activity: Not on file  Stress: Not on file  Social Connections: Not on file  Intimate Partner Violence: Not on file    Physical Exam: Vital signs in last 24 hours: '@BP'$  (!) 148/78   Pulse 76   Temp (!) 97.1 F (36.2 C)   Ht '5\' 4"'$  (1.626 m)   Wt 161 lb (73 kg)   LMP 10/03/2011   SpO2 98%   BMI 27.64 kg/m  GEN: NAD EYE: Sclerae anicteric ENT: MMM CV: Non-tachycardic Pulm: CTA b/l GI: Soft, NT/ND NEURO:  Alert & Oriented x [place], DO [facility]   03/16/2022 10:30 AM

## 2022-03-16 NOTE — Patient Instructions (Addendum)
Handout on polyps, gastritis, hemorrhoids and hemorrhoid banding provided   Await pathology results.   Continue current medications.  Repeat Colonoscopy in 1 year for surveillance due to piecemeal resection via forceps. Plan for procedure to be done at [facility] with abdominal binder and ultraslim scope.  YOU HAD AN ENDOSCOPIC PROCEDURE TODAY AT [facility]:   Refer to the procedure report that was given to you for any specific questions about what was found during the examination.  If the procedure report does not answer your questions, please call your gastroenterologist to clarify.  If you requested that your care partner not be given the details of your procedure findings, then the procedure report has been included in a sealed envelope for you to review at your convenience later.  YOU SHOULD EXPECT: Some feelings of bloating in the abdomen. Passage of more gas than usual.  Walking can help get rid of the air that was put into your GI tract during the procedure and reduce the bloating. If you had a lower endoscopy (such as a colonoscopy or flexible sigmoidoscopy) you may notice spotting of blood in your stool or on the toilet paper. If you underwent a bowel prep for your procedure, you may not have a normal bowel movement for a few days.  Please Note:  You might notice some irritation and congestion in your nose or some drainage.  This is from the oxygen used during your procedure.  There is no need for concern and it should clear up in a day or so.  SYMPTOMS TO REPORT IMMEDIATELY:  Following lower endoscopy (colonoscopy or flexible sigmoidoscopy):  Excessive amounts of blood in the stool  Significant tenderness or worsening of abdominal pains  Swelling of the abdomen that is new, acute  Fever of 100F or higher  Following upper endoscopy (EGD)  Vomiting of blood or coffee ground material  New chest pain or pain under the shoulder blades  Painful or  persistently difficult swallowing  New shortness of breath  Fever of 100F or higher  Black, tarry-looking stools  For urgent or emergent issues, a gastroenterologist can be reached at any hour by calling [phone]. Do not use MyChart messaging for urgent concerns.    DIET:  We do recommend a small meal at first, but then you may proceed to your regular diet.  Drink plenty of fluids but you should avoid alcoholic beverages for 24 hours.  ACTIVITY:  You should plan to take it easy for the rest of today and you should NOT DRIVE or use heavy machinery until tomorrow (because of the sedation medicines used during the test).    FOLLOW UP: Our staff will call the number listed on your records 24-72 hours following your procedure to check on you and address any questions or concerns that you may have regarding the information given to you following your procedure. If we do not reach you, we will leave a message.  We will attempt to reach you two times.  During this call, we will ask if you have developed any symptoms of COVID 19. If you develop any symptoms (ie: fever, flu-like symptoms, shortness of breath, cough etc.) before then, please call [phone].  If you test positive for Covid 19 in the 2 weeks post procedure, please call and report this information to [place].    If any biopsies were taken you will be contacted by phone or by letter within the next 1-3 weeks.  Please call us  at [phone] if you have not heard about the biopsies in 3 weeks.    SIGNATURES/CONFIDENTIALITY: You and/or your care partner have signed paperwork which will be entered into your electronic medical record.  These signatures attest to the fact that that the information above on your After Visit Summary has been reviewed and is understood.  Full responsibility of the confidentiality of this discharge information lies with you and/or your care-partner.

## 2022-03-16 NOTE — Progress Notes (Signed)
Sedate, gd SR, tolerated procedure well, VSS, report to RN 

## 2022-03-17 ENCOUNTER — Telehealth: Payer: Self-pay

## 2022-03-17 NOTE — Telephone Encounter (Signed)
  Follow up Call-     03/16/2022   10:16 AM  Call back number  Post procedure Call Back phone  # [phone]  Permission to leave phone message Yes     Patient questions:  Do you have a fever, pain , or abdominal swelling? No. Pain Score  0 *  Have you tolerated food without any problems? Yes.    Have you been able to return to your normal activities? Yes.    Do you have any questions about your discharge instructions: Diet   No. Medications  No. Follow up visit  No.  Do you have questions or concerns about your Care? No.  Actions: * If pain score is 4 or above: No action needed, pain <4.

## 2022-03-18 ENCOUNTER — Encounter: Payer: Self-pay | Admitting: Gastroenterology

## 2022-03-18 ENCOUNTER — Other Ambulatory Visit: Payer: Self-pay | Admitting: Family Medicine

## 2022-03-18 DIAGNOSIS — Z1231 Encounter for screening mammogram for malignant neoplasm of breast: Secondary | ICD-10-CM

## 2022-03-19 ENCOUNTER — Ambulatory Visit
Admission: RE | Admit: 2022-03-19 | Discharge: 2022-03-19 | Disposition: A | Discharge status: Home / Self Care | Payer: PRIVATE HEALTH INSURANCE | Source: Ambulatory Visit | Attending: Family Medicine | Admitting: Family Medicine

## 2022-03-19 DIAGNOSIS — Z1231 Encounter for screening mammogram for malignant neoplasm of breast: Secondary | ICD-10-CM

## 2022-03-25 ENCOUNTER — Telehealth: Payer: Self-pay | Admitting: Gastroenterology

## 2022-03-25 NOTE — Telephone Encounter (Signed)
Spoke with pt. Pt scheduled for hem band appt on 7/31 at 1:40 pm. Pt verbalized understanding and had no other concerns at end of call.

## 2022-05-03 ENCOUNTER — Ambulatory Visit (INDEPENDENT_AMBULATORY_CARE_PROVIDER_SITE_OTHER): Payer: PRIVATE HEALTH INSURANCE | Admitting: Gastroenterology

## 2022-05-03 ENCOUNTER — Encounter: Payer: Self-pay | Admitting: Gastroenterology

## 2022-05-03 VITALS — BP 114/78 | HR 79 | Ht 64.0 in | Wt 165.4 lb

## 2022-05-03 DIAGNOSIS — Z86010 Personal history of colonic polyps: Secondary | ICD-10-CM

## 2022-05-03 DIAGNOSIS — K641 Second degree hemorrhoids: Secondary | ICD-10-CM | POA: Diagnosis not present

## 2022-05-03 DIAGNOSIS — R1310 Dysphagia, unspecified: Secondary | ICD-10-CM

## 2022-05-03 NOTE — Progress Notes (Signed)
Chief Complaint:    Symptomatic Internal Hemorrhoids; Hemorrhoid Band Ligation   GI History: 61 year old female with history of symptomatic hemorrhoids (intermittent BRBPR), with suboptimal response to typical conservative management.  Separately, history of intermittent solid food dysphagia. Tends to occur with tuna salad, bread, rice.  Clears with water, but occasionally has to regurgitate food back out.  No GERD symptoms.  - 01/25/2022: Initial appointment in GI clinic - 03/16/2022: EGD: Normal esophagus, empiric dilation with 17 mm Savary then biopsied (path c/w reflux changes; negative for EoE).  Mild gastritis, otherwise normal duodenum - 03/16/2022: Colonoscopy: A total of 6 subcentimeter tubular adenomas.  Grade 2 internal hemorrhoids.  Normal TI.  Repeat in 3 years   Endoscopic History: - Colonoscopy (10/2011): Scattered small nonbleeding AVMs in the ascending and descending colon.  Otherwise normal colon.  Repeat in 10 years - EGD (03/2022): Normal esophagus, empiric dilation with 17 mm Savary then biopsied (path c/w reflux changes; negative for EoE).  Mild gastritis, otherwise normal duodenum - Colonoscopy (03/2022): 6 subcentimeter tubular adenomas. Grade 2 internal hemorrhoids.  Normal TI.  Repeat in 3 years  HPI:     Patient is a 61 y.o. femalewith a history of symptomatic internal hemorrhoids presenting to the Gastroenterology Clinic for follow-up and ongoing treatment. The patient presents with symptomatic grade 2 hemorrhoids, unresponsive to maximal medical therapy, requesting rubber band ligation of symptomatic hemorrhoidal disease.  No change in medical or surgical history, medications, allergies, social history since last appointment with me.   Review of systems:     No chest pain, no SOB, no fevers, no urinary sx   Past Medical History:  Diagnosis Date   Allergy    Heart murmur     Patient's surgical history, family medical history, social history, medications and  allergies were all reviewed in Epic    Current Outpatient Medications  Medication Sig Dispense Refill   CALCIUM PO Take 1,200 mg by mouth daily.     Cholecalciferol (VITAMIN D PO) Take 1,000 Units by mouth daily.     DOCUSATE CALCIUM PO Take 100 mg by mouth daily.     fluticasone (FLONASE) 50 MCG/ACT nasal spray Place into both nostrils daily.     loratadine (CLARITIN) 10 MG tablet Take 10 mg by mouth daily.     MAGNESIUM PO Take 250 mg by mouth daily.     No current facility-administered medications for this visit.    Physical Exam:     BP 114/78   Pulse 79   Ht '5\' 4"'$  (1.626 m)   Wt 165 lb 6 oz (75 kg)   LMP 10/03/2011   BMI 28.39 kg/m   GENERAL:  Pleasant female in NAD PSYCH: : Cooperative, normal affect NEURO: Alert and oriented x 3, no focal neurologic deficits Rectal exam: Sensation intact and preserved anal wink.  Grade 2 hemorrhoids noted in all positions on anoscopy.  RP hemorrhoid column slightly inflamed with scant blood on surface.  No external anal fissures noted. Normal sphincter tone. No palpable mass. No blood on the exam glove. (Chaperone: Renee Rival, CMA).   IMPRESSION and PLAN:    #1.  Symptomatic internal hemorrhoids: PROCEDURE NOTE: The patient presents with symptomatic grade 2 hemorrhoids, unresponsive to maximal medical therapy, requesting rubber band ligation of symptomatic hemorrhoidal disease.  All risks, benefits and alternative forms of therapy were described and informed consent was obtained.  In the Left Lateral Decubitus position, anoscopic examination revealed grade 2 hemorrhoids in the all position(s).  The  anorectum was pre-medicated with RectiCare. The decision was made to band the RP internal hemorrhoid, and the Gilberts was used to perform band ligation without complication.  Digital anorectal examination was then performed to assure proper positioning of the band, and to adjust the banded tissue as required.  The patient was  discharged home without pain or other issues.  Dietary and behavioral recommendations were given and along with follow-up instructions.     The following adjunctive treatments were recommended:  -Resume high-fiber diet with fiber supplement (i.e. Citrucel or Benefiber) with goal for soft stools without straining to have a BM. -Resume adequate fluid intake.  The patient will return in 4 for follow-up and possible additional banding as required. No complications were encountered and the patient tolerated the procedure well.      #2.  History of colon polyps - Repeat colonoscopy in 2026 for ongoing polyp surveillance  #3.  Dysphagia #4.  GERD (endoscopic onset reflux without clinical reflux) -Dysphagia improved with EGD with empiric dilation - Conservative management      Garza-Salinas II ,DO, FACG 05/03/2022, 2:07 PM

## 2022-05-03 NOTE — Patient Instructions (Addendum)
If you are age 61 or younger, your body mass index should be between 19-25. Your Body mass index is 28.39 kg/m. If this is out of the aformentioned range listed, please consider follow up with your Primary Care Provider.   __________________________________________________________  The [facility] providers would like to encourage you to use [facility] to communicate with providers for non-urgent requests or questions.  Due to long hold times on the telephone, sending your provider a message by [facility] may be a faster and more efficient way to get a response.  Please allow 48 business hours for a response.  Please remember that this is for non-urgent requests.   Due to recent changes in healthcare laws, you may see the results of your imaging and laboratory studies on MyChart before your provider has had a chance to review them.  We understand that in some cases there may be results that are confusing or concerning to you. Not all laboratory results come back in the same time frame and the provider may be waiting for multiple results in order to interpret others.  Please give [place] 48 hours in order for your provider to thoroughly review all the results before contacting the office for clarification of your results.    You have been scheduled for an appointment with Dr. Bryan Lemma on 05/31/22 at 1.40 pm. Please arrive 10 minutes early for your appointment.   HEMORRHOID BANDING PROCEDURE    FOLLOW-UP CARE   The procedure you have had should have been relatively painless since the banding of the area involved does not have nerve endings and there is no pain sensation.  The rubber band cuts off the blood supply to the hemorrhoid and the band may fall off as soon as 48 hours after the banding (the band may occasionally be seen in the toilet bowl following a bowel movement). You may notice a temporary feeling of fullness in the rectum which should respond adequately to plain Tylenol or Motrin.  Following the  banding, avoid strenuous exercise that evening and resume full activity the next day.  A sitz bath (soaking in a warm tub) or bidet is soothing, and can be useful for cleansing the area after bowel movements.     To avoid constipation, take two tablespoons of natural wheat bran, natural oat bran, flax, Benefiber or any over the counter fiber supplement and increase your water intake to 7-8 glasses daily.    Unless you have been prescribed anorectal medication, do not put anything inside your rectum for two weeks: No suppositories, enemas, fingers, etc.  Occasionally, you may have more bleeding than usual after the banding procedure.  This is often from the untreated hemorrhoids rather than the treated one.  Don't be concerned if there is a tablespoon or so of blood.  If there is more blood than this, lie flat with your bottom higher than your head and apply an ice pack to the area. If the bleeding does not stop within a half an hour or if you feel faint, call our office at [phone] or go to the emergency room.  Problems are not common; however, if there is a substantial amount of bleeding, severe pain, chills, fever or difficulty passing urine (very rare) or other problems, you should call us at [phone] or report to the nearest emergency room.  Do not stay seated continuously for more than 2-3 hours for a day or two after the procedure.  Tighten your buttock muscles 10-15 times every  two hours and take 10-15 deep breaths every 1-2 hours.  Do not spend more than a few minutes on the toilet if you cannot empty your bowel; instead re-visit the toilet at a later time.    Thank you for choosing me and [facility].  Vito Cirigliano, D.O.

## 2022-05-31 ENCOUNTER — Encounter: Payer: Self-pay | Admitting: Gastroenterology

## 2022-05-31 ENCOUNTER — Ambulatory Visit (INDEPENDENT_AMBULATORY_CARE_PROVIDER_SITE_OTHER): Payer: PRIVATE HEALTH INSURANCE | Admitting: Gastroenterology

## 2022-05-31 VITALS — BP 110/68 | HR 90 | Wt 160.6 lb

## 2022-05-31 DIAGNOSIS — K649 Unspecified hemorrhoids: Secondary | ICD-10-CM | POA: Diagnosis not present

## 2022-05-31 NOTE — Progress Notes (Signed)
Chief Complaint:    Symptomatic Internal Hemorrhoids; Hemorrhoid Band Ligation  GI History: 61 year old female with history of symptomatic hemorrhoids (intermittent BRBPR), with suboptimal response to typical conservative management.  Separately, history of intermittent solid food dysphagia. Tends to occur with tuna salad, bread, rice.  Clears with water, but occasionally has to regurgitate food back out.  No GERD symptoms.   - 01/25/2022: Initial appointment in GI clinic - 03/16/2022: EGD: Normal esophagus, empiric dilation with 17 mm Savary then biopsied (path c/w reflux changes; negative for EoE).  Mild gastritis, otherwise normal duodenum - 03/16/2022: Colonoscopy: A total of 6 subcentimeter tubular adenomas.  Grade 2 internal hemorrhoids.  Normal TI.  Repeat in 3 years - 05/03/2022: Banding of RP hemorrhoid - 05/23/2022: Presents for hemorrhoid banding #2     Endoscopic History: - Colonoscopy (10/2011): Scattered small nonbleeding AVMs in the ascending and descending colon.  Otherwise normal colon.  Repeat in 10 years - EGD (03/2022): Normal esophagus, empiric dilation with 17 mm Savary then biopsied (path c/w reflux changes; negative for EoE).  Mild gastritis, otherwise normal duodenum - Colonoscopy (03/2022): 6 subcentimeter tubular adenomas. Grade 2 internal hemorrhoids.  Normal TI.  Repeat in 3 years   HPI:     Patient is a 61 y.o. femalewith a history of symptomatic internal hemorrhoids presenting to the Gastroenterology Clinic for follow-up and ongoing treatment. The patient presents with symptomatic grade 2 hemorrhoids, unresponsive to maximal medical therapy, requesting rubber band ligation of symptomatic hemorrhoidal disease.  Reports a robust response to hemorrhoid banding #1, essentially with resolution of hematochezia.  No change in medical or surgical history, medications, allergies, social history since last appointment with me.   Review of systems:     No chest pain, no  SOB, no fevers, no urinary sx   Past Medical History:  Diagnosis Date   Allergy    Heart murmur     Patient's surgical history, family medical history, social history, medications and allergies were all reviewed in Epic    Current Outpatient Medications  Medication Sig Dispense Refill   CALCIUM PO Take 1,200 mg by mouth daily.     Cholecalciferol (VITAMIN D PO) Take 1,000 Units by mouth daily.     fluticasone (FLONASE) 50 MCG/ACT nasal spray Place into both nostrils daily.     loratadine (CLARITIN) 10 MG tablet Take 10 mg by mouth daily.     MAGNESIUM PO Take 250 mg by mouth daily.     DOCUSATE CALCIUM PO Take 100 mg by mouth daily.     No current facility-administered medications for this visit.    Physical Exam:     BP 110/68   Pulse 90   Wt 160 lb 9.6 oz (72.8 kg)   LMP 10/03/2011   SpO2 100%   BMI 27.57 kg/m   GENERAL:  Pleasant female in NAD PSYCH: : Cooperative, normal affect NEURO: Alert and oriented x 3, no focal neurologic deficits Rectal exam: Sensation intact and preserved anal wink.  External skin tags.  Grade 1 hemorrhoid in LL position on exam.  No external anal fissures noted. Normal sphincter tone. No palpable mass. No blood on the exam glove. (Chaperone: Curlene Labrum, CMA).   IMPRESSION and PLAN:    #1.  Symptomatic internal hemorrhoids: PROCEDURE NOTE: The patient presents with symptomatic grade 1-2 hemorrhoids, unresponsive to maximal medical therapy, requesting rubber band ligation of symptomatic hemorrhoidal disease.  All risks, benefits and alternative forms of therapy were described and informed consent was obtained.  In the Left Lateral Decubitus position, anoscopic examination revealed grade 1 hemorrhoids in the LL position(s).  The anorectum was pre-medicated with RectiCare. The decision was made to band the LL internal hemorrhoid, and the Ryan was used to perform band ligation without complication.  Digital anorectal examination  was then performed to assure proper positioning of the band, and to adjust the banded tissue as required.  The patient was discharged home without pain or other issues.  Dietary and behavioral recommendations were given and along with follow-up instructions.     The following adjunctive treatments were recommended:  -Resume high-fiber diet with fiber supplement (i.e. Citrucel or Benefiber) with goal for soft stools without straining to have a BM. -Resume adequate fluid intake.  The patient will return as needed if recurrence of hemorrhoidal symptoms for follow-up and possible additional banding as required. No complications were encountered and the patient tolerated the procedure well.  #2.  History of colon polyps - Repeat colonoscopy in 2026 for ongoing polyp surveillance  #3.  Dysphagia #4.  GERD (endoscopic onset reflux without clinical reflux) - Dysphagia improved with EGD with empiric dilation - Conservative management      Torsha Lemus V Ronnell Makarewicz ,DO, FACG 05/31/2022, 1:50 PM

## 2022-05-31 NOTE — Patient Instructions (Addendum)
If you are age 61 or younger, your body mass index should be between 19-25. Your There is no height or weight on file to calculate BMI. If this is out of the aformentioned range listed, please consider follow up with your Primary Care Provider.   __________________________________________________________  The [facility] providers would like to encourage you to use [facility] to communicate with providers for non-urgent requests or questions.  Due to long hold times on the telephone, sending your provider a message by [facility] may be a faster and more efficient way to get a response.  Please allow 48 business hours for a response.  Please remember that this is for non-urgent requests.    Follow up as needed. Please call with any questions or concerns.   HEMORRHOID BANDING PROCEDURE    FOLLOW-UP CARE   The procedure you have had should have been relatively painless since the banding of the area involved does not have nerve endings and there is no pain sensation.  The rubber band cuts off the blood supply to the hemorrhoid and the band may fall off as soon as 48 hours after the banding (the band may occasionally be seen in the toilet bowl following a bowel movement). You may notice a temporary feeling of fullness in the rectum which should respond adequately to plain Tylenol or Motrin.  Following the banding, avoid strenuous exercise that evening and resume full activity the next day.  A sitz bath (soaking in a warm tub) or bidet is soothing, and can be useful for cleansing the area after bowel movements.     To avoid constipation, take two tablespoons of natural wheat bran, natural oat bran, flax, Benefiber or any over the counter fiber supplement and increase your water intake to 7-8 glasses daily.    Unless you have been prescribed anorectal medication, do not put anything inside your rectum for two weeks: No suppositories, enemas, fingers, etc.  Occasionally, you may have more bleeding than  usual after the banding procedure.  This is often from the untreated hemorrhoids rather than the treated one.  Don't be concerned if there is a tablespoon or so of blood.  If there is more blood than this, lie flat with your bottom higher than your head and apply an ice pack to the area. If the bleeding does not stop within a half an hour or if you feel faint, call our office at [phone] or go to the emergency room.  Problems are not common; however, if there is a substantial amount of bleeding, severe pain, chills, fever or difficulty passing urine (very rare) or other problems, you should call us at [phone] or report to the nearest emergency room.  Do not stay seated continuously for more than 2-3 hours for a day or two after the procedure.  Tighten your buttock muscles 10-15 times every two hours and take 10-15 deep breaths every 1-2 hours.  Do not spend more than a few minutes on the toilet if you cannot empty your bowel; instead re-visit the toilet at a later time.   Thank you for choosing me and [facility].  Vito Cirigliano, D.O.

## 2022-06-01 ENCOUNTER — Other Ambulatory Visit: Payer: Self-pay | Admitting: Family Medicine

## 2022-06-01 DIAGNOSIS — E785 Hyperlipidemia, unspecified: Secondary | ICD-10-CM

## 2022-06-28 ENCOUNTER — Ambulatory Visit: Payer: PRIVATE HEALTH INSURANCE | Admitting: Gastroenterology

## 2022-08-23 ENCOUNTER — Other Ambulatory Visit: Payer: PRIVATE HEALTH INSURANCE

## 2022-09-13 ENCOUNTER — Ambulatory Visit
Admission: RE | Admit: 2022-09-13 | Discharge: 2022-09-13 | Disposition: A | Discharge status: Home / Self Care | Payer: No Typology Code available for payment source | Source: Ambulatory Visit | Attending: Family Medicine | Admitting: Family Medicine

## 2022-09-13 DIAGNOSIS — E785 Hyperlipidemia, unspecified: Secondary | ICD-10-CM

## 2023-07-28 ENCOUNTER — Other Ambulatory Visit: Payer: Self-pay | Admitting: Family Medicine

## 2023-07-28 DIAGNOSIS — Z1231 Encounter for screening mammogram for malignant neoplasm of breast: Secondary | ICD-10-CM

## 2023-08-23 ENCOUNTER — Ambulatory Visit
Admission: RE | Admit: 2023-08-23 | Discharge: 2023-08-23 | Disposition: A | Discharge status: Home / Self Care | Payer: PRIVATE HEALTH INSURANCE | Source: Ambulatory Visit | Attending: Family Medicine | Admitting: Family Medicine

## 2023-08-23 DIAGNOSIS — Z1231 Encounter for screening mammogram for malignant neoplasm of breast: Secondary | ICD-10-CM

## 2024-09-24 ENCOUNTER — Other Ambulatory Visit: Payer: Self-pay | Admitting: Family Medicine

## 2024-09-24 DIAGNOSIS — Z1231 Encounter for screening mammogram for malignant neoplasm of breast: Secondary | ICD-10-CM

## 2024-10-17 ENCOUNTER — Ambulatory Visit
Admission: RE | Admit: 2024-10-17 | Discharge: 2024-10-17 | Disposition: A | Discharge status: Home / Self Care | Payer: PRIVATE HEALTH INSURANCE | Source: Ambulatory Visit | Attending: Family Medicine | Admitting: Family Medicine

## 2024-10-17 DIAGNOSIS — Z1231 Encounter for screening mammogram for malignant neoplasm of breast: Secondary | ICD-10-CM
